# Patient Record
Sex: Male | Born: 1945 | Race: White | Hispanic: No | Marital: Married | Smoking: Never smoker
Health system: Southern US, Community
[De-identification: ages and names within clinical notes are randomized; demographics above are authoritative.]

## PROBLEM LIST (undated history)

## (undated) DIAGNOSIS — I10 Essential (primary) hypertension: Secondary | ICD-10-CM

## (undated) DIAGNOSIS — J45909 Unspecified asthma, uncomplicated: Secondary | ICD-10-CM

## (undated) HISTORY — PX: NASAL SINUS SURGERY: SHX719

## (undated) HISTORY — PX: APPENDECTOMY: SHX54

---

## 1998-10-02 ENCOUNTER — Emergency Department (HOSPITAL_COMMUNITY): Admission: EM | Admit: 1998-10-02 | Discharge: 1998-10-02 | Payer: Self-pay | Admitting: Emergency Medicine

## 2001-04-23 ENCOUNTER — Emergency Department (HOSPITAL_COMMUNITY): Admission: EM | Admit: 2001-04-23 | Discharge: 2001-04-23 | Payer: Self-pay | Admitting: Emergency Medicine

## 2003-06-03 ENCOUNTER — Emergency Department (HOSPITAL_COMMUNITY): Admission: EM | Admit: 2003-06-03 | Discharge: 2003-06-03 | Payer: Self-pay | Admitting: Emergency Medicine

## 2006-03-08 ENCOUNTER — Ambulatory Visit: Payer: Self-pay | Admitting: Family Medicine

## 2006-03-22 ENCOUNTER — Ambulatory Visit: Payer: Self-pay | Admitting: Physician Assistant

## 2008-08-30 ENCOUNTER — Emergency Department (HOSPITAL_COMMUNITY): Admission: EM | Admit: 2008-08-30 | Discharge: 2008-08-30 | Payer: Self-pay | Admitting: Emergency Medicine

## 2012-05-24 ENCOUNTER — Emergency Department (HOSPITAL_COMMUNITY): Payer: Medicare Other

## 2012-05-24 ENCOUNTER — Emergency Department (HOSPITAL_COMMUNITY)
Admission: EM | Admit: 2012-05-24 | Discharge: 2012-05-24 | Disposition: A | Discharge status: Home / Self Care | Payer: Medicare Other | Attending: Emergency Medicine | Admitting: Emergency Medicine

## 2012-05-24 ENCOUNTER — Encounter (HOSPITAL_COMMUNITY): Payer: Self-pay | Admitting: *Deleted

## 2012-05-24 DIAGNOSIS — J40 Bronchitis, not specified as acute or chronic: Secondary | ICD-10-CM

## 2012-05-24 DIAGNOSIS — Z7982 Long term (current) use of aspirin: Secondary | ICD-10-CM | POA: Insufficient documentation

## 2012-05-24 DIAGNOSIS — IMO0002 Reserved for concepts with insufficient information to code with codable children: Secondary | ICD-10-CM | POA: Insufficient documentation

## 2012-05-24 DIAGNOSIS — I10 Essential (primary) hypertension: Secondary | ICD-10-CM | POA: Insufficient documentation

## 2012-05-24 DIAGNOSIS — J45901 Unspecified asthma with (acute) exacerbation: Secondary | ICD-10-CM | POA: Insufficient documentation

## 2012-05-24 HISTORY — DX: Essential (primary) hypertension: I10

## 2012-05-24 HISTORY — DX: Unspecified asthma, uncomplicated: J45.909

## 2012-05-24 LAB — POCT I-STAT, CHEM 8
BUN: 14 mg/dL (ref 6–23)
Chloride: 109 mEq/L (ref 96–112)
HCT: 45 % (ref 39.0–52.0)
Potassium: 3.9 mEq/L (ref 3.5–5.1)

## 2012-05-24 MED ORDER — ALBUTEROL SULFATE HFA 108 (90 BASE) MCG/ACT IN AERS: 2.0000 | INHALATION_SPRAY | RESPIRATORY_TRACT | Status: DC | PRN

## 2012-05-24 MED ORDER — ALBUTEROL (5 MG/ML) CONTINUOUS INHALATION SOLN
15.0000 mg/h | INHALATION_SOLUTION | Freq: Once | RESPIRATORY_TRACT | Status: AC
  Administered 2012-05-24: 15 mg/h via RESPIRATORY_TRACT
  Filled 2012-05-24: qty 20

## 2012-05-24 MED ORDER — MUCINEX DM MAXIMUM STRENGTH 60-1200 MG PO TB12: 1.0000 | ORAL_TABLET | Freq: Two times a day (BID) | ORAL | Status: DC

## 2012-05-24 MED ORDER — PREDNISONE 50 MG PO TABS
60.0000 mg | ORAL_TABLET | Freq: Once | ORAL | Status: AC
  Administered 2012-05-24: 60 mg via ORAL
  Filled 2012-05-24: qty 1

## 2012-05-24 MED ORDER — AZITHROMYCIN 250 MG PO TABS: ORAL_TABLET | ORAL | Status: DC

## 2012-05-24 MED ORDER — PREDNISONE 20 MG PO TABS: ORAL_TABLET | ORAL | Status: DC

## 2012-05-24 MED ORDER — AEROCHAMBER Z-STAT PLUS/MEDIUM MISC: 1.0000 | Freq: Once | Status: DC

## 2012-05-24 MED ORDER — IPRATROPIUM BROMIDE 0.02 % IN SOLN
0.5000 mg | Freq: Once | RESPIRATORY_TRACT | Status: AC
  Administered 2012-05-24: 0.5 mg via RESPIRATORY_TRACT
  Filled 2012-05-24: qty 2.5

## 2012-05-24 NOTE — Progress Notes (Signed)
Pt was given an spacer for inhaler

## 2012-05-24 NOTE — ED Notes (Signed)
Cough, sob for 1 week,  White-yellow sputum.  wheeze

## 2012-05-24 NOTE — ED Provider Notes (Signed)
History   Scribed for Ward Givens, MD, the patient was seen in room APA05/APA05 . This chart was scribed by Lewanda Rife.   CSN: [id number]  Arrival date & time 05/24/12  1618   First MD Initiated Contact with Patient 05/24/12 1747      Chief Complaint  Patient presents with  . Shortness of Breath    (Consider location/radiation/quality/duration/timing/severity/associated sxs/prior treatment) HPI Seth Hanson is a 67 y.o. male who presents to the Emergency Department complaining of constant and worsening shortness of breath for the past 4 days. Pt noted his shortness of breath was at its worst today while driving. Pt reports audible wheezing at night. Pt reports productive cough with white sputum, and mild rhinorrhea with clear mucus. Pt denies fever, sore throat, nausea, vomiting, and diarrhea. Pt reports having a hx of asthma and states his albuterol inhaler mildly alleviates his symptoms. Pt states he has never been admitted to the hospital because of his asthma.   Pt states he usually goes to the [place] [facility] in [place]. Pt denies smoking.    Past Medical History  Diagnosis Date  . Asthma   . Hypertension     Past Surgical History  Procedure Date  . Nasal sinus surgery   . Appendectomy     History reviewed. No pertinent family history.  History  Substance Use Topics  . Smoking status: Never Smoker   . Smokeless tobacco: Not on file  . Alcohol Use: Yes   Lives at home Self employed   Review of Systems  Constitutional: Negative for fever.  HENT: Positive for rhinorrhea. Negative for sore throat.   Respiratory: Positive for cough, shortness of breath and wheezing.   Gastrointestinal: Negative for nausea, vomiting and diarrhea.  All other systems reviewed and are negative.    Allergies  Other  Home Medications   Current Outpatient Rx  Name  Route  Sig  Dispense  Refill  . ALBUTEROL SULFATE HFA 108 (90 BASE) MCG/ACT IN AERS   Inhalation  Inhale 2 puffs into the lungs every 6 (six) hours as needed. For shortness of breath         . ASPIRIN EC 81 MG PO TBEC   Oral   Take 81 mg by mouth every morning.           BP 176/98  Pulse 93  Temp 97.2 F (36.2 C) (Oral)  Resp 20  Ht 6\' 3"  (1.905 m)  Wt 220 lb (99.791 kg)  BMI 27.50 kg/m2  SpO2 96%  Vital signs normal   Physical Exam  Nursing note and vitals reviewed. Constitutional: He is oriented to person, place, and time. He appears well-developed and well-nourished.  Non-toxic appearance. He does not appear ill. No distress.  HENT:  Head: Normocephalic and atraumatic.  Right Ear: External ear normal.  Left Ear: External ear normal.  Nose: Nose normal. No mucosal edema or rhinorrhea.  Mouth/Throat: Oropharynx is clear and moist and mucous membranes are normal. No dental abscesses or uvula swelling.  Eyes: Conjunctivae normal and EOM are normal. Pupils are equal, round, and reactive to light.  Neck: Normal range of motion and full passive range of motion without pain. Neck supple.  Cardiovascular: Normal rate, regular rhythm and normal heart sounds.  Exam reveals no gallop and no friction rub.   No murmur heard. Pulmonary/Chest: He is in respiratory distress. He has wheezes. He has no rhonchi. He has no rales. He exhibits no tenderness and no crepitus.  Diminished breath sounds, diffuse rhonchi, and high pitch expiratory wheezing, frequent coughing and cannot inhale deeply without coughing.  Abdominal: Soft. Normal appearance and bowel sounds are normal. He exhibits no distension. There is no tenderness. There is no rebound and no guarding.  Musculoskeletal: Normal range of motion. He exhibits no edema and no tenderness.       Moves all extremities well.   Neurological: He is alert and oriented to person, place, and time. He has normal strength. No cranial nerve deficit.  Skin: Skin is warm, dry and intact. No rash noted. No erythema. No pallor.  Psychiatric: He  has a normal mood and affect. His speech is normal and behavior is normal. His mood appears not anxious.    ED Course  Procedures (including critical care time)    Medications  albuterol (PROVENTIL,VENTOLIN) solution continuous neb (not administered)  ipratropium (ATROVENT) nebulizer solution 0.5 mg (not administered)  predniSONE (DELTASONE) tablet 60 mg (not administered)   7:55 PM recheck after continuous nebulizer almost done Low pitch rhonchi and improved air movement   2030 p.m. patient has scattered rhonchi. He was angulated by nursing staff and his pulse ox remained 93% on room air. Patient wante Except for hypertensiond to be discharged.  Pt given a spacer to take him.   Results for orders placed during the hospital encounter of 05/24/12  POCT I-STAT, CHEM 8      Component Value Range   Sodium 141  135 - 145 mEq/L   Potassium 3.9  3.5 - 5.1 mEq/L   Chloride 109  96 - 112 mEq/L   BUN 14  6 - 23 mg/dL   Creatinine, Ser 1.61  0.50 - 1.35 mg/dL   Glucose, Bld 096 (*) 70 - 99 mg/dL   Calcium, Ion 0.45  4.09 - 1.30 mmol/L   TCO2 23  0 - 100 mmol/L   Hemoglobin 15.3  13.0 - 17.0 g/dL   HCT 81.1  91.4 - 78.2 %     Laboratory interpretation all normal   Dg Chest 2 View  05/24/2012  *RADIOLOGY REPORT*  Clinical Data: Shortness of breath.  CHEST - 2 VIEW  Comparison: None.  Findings: Cardiomediastinal silhouette appears normal.  No acute pulmonary disease is noted.  Bony thorax is intact.  IMPRESSION: No acute cardiopulmonary abnormality seen.   Original Report Authenticated By: Lupita Raider.,  M.D.      1. Asthma attack   2. Bronchitis     New Prescriptions   ALBUTEROL (PROVENTIL HFA;VENTOLIN HFA) 108 (90 BASE) MCG/ACT INHALER    Inhale 2 puffs into the lungs every 4 (four) hours as needed for wheezing.   AZITHROMYCIN (ZITHROMAX Z-PAK) 250 MG TABLET    Take 2 po the first day then once a day for the next 4 days.   DEXTROMETHORPHAN-GUAIFENESIN (MUCINEX DM MAXIMUM  STRENGTH) 60-1200 MG TB12    Take 1 tablet by mouth 2 (two) times daily.   PREDNISONE (DELTASONE) 20 MG TABLET    Take 3 po QD x 2d starting tomorrow, then 2 po QD x 3d then 1 po QD x 3d    Plan discharge  Devoria Albe, MD, FACEP   MDM    I personally performed the services described in this documentation, which was scribed in my presence. The recorded information has been reviewed and considered.  Devoria Albe, MD, Armando Gang    Ward Givens, MD 05/24/12 [phone]

## 2012-12-24 ENCOUNTER — Inpatient Hospital Stay (HOSPITAL_COMMUNITY)
Admission: EM | Admit: 2012-12-24 | Discharge: 2012-12-26 | DRG: 195 | Disposition: A | Discharge status: Home / Self Care | Payer: Medicare Other | Attending: Internal Medicine | Admitting: Internal Medicine

## 2012-12-24 ENCOUNTER — Encounter (HOSPITAL_COMMUNITY): Payer: Self-pay | Admitting: Emergency Medicine

## 2012-12-24 ENCOUNTER — Emergency Department (HOSPITAL_COMMUNITY): Payer: Medicare Other

## 2012-12-24 ENCOUNTER — Inpatient Hospital Stay (HOSPITAL_COMMUNITY): Payer: Medicare Other

## 2012-12-24 DIAGNOSIS — J45909 Unspecified asthma, uncomplicated: Secondary | ICD-10-CM | POA: Diagnosis present

## 2012-12-24 DIAGNOSIS — J189 Pneumonia, unspecified organism: Principal | ICD-10-CM | POA: Diagnosis present

## 2012-12-24 DIAGNOSIS — J45901 Unspecified asthma with (acute) exacerbation: Secondary | ICD-10-CM

## 2012-12-24 DIAGNOSIS — I10 Essential (primary) hypertension: Secondary | ICD-10-CM | POA: Diagnosis present

## 2012-12-24 DIAGNOSIS — Z23 Encounter for immunization: Secondary | ICD-10-CM

## 2012-12-24 DIAGNOSIS — IMO0001 Reserved for inherently not codable concepts without codable children: Secondary | ICD-10-CM | POA: Diagnosis present

## 2012-12-24 DIAGNOSIS — E860 Dehydration: Secondary | ICD-10-CM | POA: Diagnosis present

## 2012-12-24 DIAGNOSIS — J4521 Mild intermittent asthma with (acute) exacerbation: Secondary | ICD-10-CM

## 2012-12-24 LAB — CBC WITH DIFFERENTIAL/PLATELET
HCT: 44.6 % (ref 39.0–52.0)
Hemoglobin: 14.8 g/dL (ref 13.0–17.0)
Lymphocytes Relative: 5 % — ABNORMAL LOW (ref 12–46)
Lymphs Abs: 0.6 10*3/uL — ABNORMAL LOW (ref 0.7–4.0)
Monocytes Absolute: 1.7 10*3/uL — ABNORMAL HIGH (ref 0.1–1.0)
Monocytes Relative: 12 % (ref 3–12)
Neutro Abs: 11.6 10*3/uL — ABNORMAL HIGH (ref 1.7–7.7)
WBC: 14 10*3/uL — ABNORMAL HIGH (ref 4.0–10.5)

## 2012-12-24 LAB — URINE MICROSCOPIC-ADD ON

## 2012-12-24 LAB — BASIC METABOLIC PANEL
BUN: 15 mg/dL (ref 6–23)
CO2: 26 mEq/L (ref 19–32)
Chloride: 99 mEq/L (ref 96–112)
Creatinine, Ser: 1.16 mg/dL (ref 0.50–1.35)
Glucose, Bld: 128 mg/dL — ABNORMAL HIGH (ref 70–99)

## 2012-12-24 LAB — URINALYSIS, ROUTINE W REFLEX MICROSCOPIC
Bilirubin Urine: NEGATIVE
Ketones, ur: NEGATIVE mg/dL
Nitrite: NEGATIVE
pH: 6 (ref 5.0–8.0)

## 2012-12-24 MED ORDER — DEXTROSE 5 % IV SOLN
500.0000 mg | INTRAVENOUS | Status: DC
  Filled 2012-12-24: qty 500

## 2012-12-24 MED ORDER — ACETAMINOPHEN 325 MG PO TABS
650.0000 mg | ORAL_TABLET | ORAL | Status: DC | PRN
  Administered 2012-12-24 – 2012-12-25 (×2): 650 mg via ORAL
  Filled 2012-12-24 (×2): qty 2

## 2012-12-24 MED ORDER — LISINOPRIL 10 MG PO TABS
40.0000 mg | ORAL_TABLET | Freq: Every day | ORAL | Status: DC
  Administered 2012-12-24 – 2012-12-26 (×3): 40 mg via ORAL
  Filled 2012-12-24 (×3): qty 4

## 2012-12-24 MED ORDER — ALBUTEROL SULFATE (5 MG/ML) 0.5% IN NEBU
2.5000 mg | INHALATION_SOLUTION | RESPIRATORY_TRACT | Status: DC | PRN
  Administered 2012-12-24 – 2012-12-25 (×2): 2.5 mg via RESPIRATORY_TRACT
  Filled 2012-12-24 (×2): qty 0.5

## 2012-12-24 MED ORDER — ONDANSETRON 8 MG PO TBDP: 8.0000 mg | ORAL_TABLET | Freq: Once | ORAL | Status: DC

## 2012-12-24 MED ORDER — LORATADINE 10 MG PO TABS: 10.0000 mg | ORAL_TABLET | Freq: Every day | ORAL | Status: DC | PRN

## 2012-12-24 MED ORDER — PNEUMOCOCCAL VAC POLYVALENT 25 MCG/0.5ML IJ INJ
0.5000 mL | INJECTION | INTRAMUSCULAR | Status: AC
  Administered 2012-12-25: 0.5 mL via INTRAMUSCULAR
  Filled 2012-12-24: qty 0.5

## 2012-12-24 MED ORDER — IPRATROPIUM BROMIDE 0.02 % IN SOLN
0.5000 mg | Freq: Once | RESPIRATORY_TRACT | Status: AC
  Administered 2012-12-24: 0.5 mg via RESPIRATORY_TRACT
  Filled 2012-12-24: qty 2.5

## 2012-12-24 MED ORDER — LIDOCAINE HCL (PF) 1 % IJ SOLN
30.0000 mL | Freq: Once | INTRAMUSCULAR | Status: DC
  Filled 2012-12-24: qty 30

## 2012-12-24 MED ORDER — SODIUM CHLORIDE 0.9 % IV SOLN
INTRAVENOUS | Status: DC
  Administered 2012-12-24 – 2012-12-25 (×4): via INTRAVENOUS

## 2012-12-24 MED ORDER — DEXTROSE 5 % IV SOLN
1.0000 g | Freq: Once | INTRAVENOUS | Status: AC
  Administered 2012-12-24: 1 g via INTRAVENOUS
  Filled 2012-12-24: qty 10

## 2012-12-24 MED ORDER — AZITHROMYCIN 250 MG PO TABS
500.0000 mg | ORAL_TABLET | Freq: Once | ORAL | Status: AC
  Administered 2012-12-24: 500 mg via ORAL
  Filled 2012-12-24: qty 2

## 2012-12-24 MED ORDER — HYDROCHLOROTHIAZIDE 25 MG PO TABS
25.0000 mg | ORAL_TABLET | Freq: Every day | ORAL | Status: DC
  Administered 2012-12-24 – 2012-12-26 (×3): 25 mg via ORAL
  Filled 2012-12-24 (×3): qty 1

## 2012-12-24 MED ORDER — ONDANSETRON HCL 4 MG PO TABS
4.0000 mg | ORAL_TABLET | Freq: Three times a day (TID) | ORAL | Status: DC | PRN
  Administered 2012-12-24: 4 mg via ORAL
  Filled 2012-12-24: qty 1

## 2012-12-24 MED ORDER — HYDROMORPHONE HCL PF 2 MG/ML IJ SOLN: 1.0000 mg | Freq: Once | INTRAMUSCULAR | Status: DC

## 2012-12-24 MED ORDER — ACETAMINOPHEN 500 MG PO TABS
1000.0000 mg | ORAL_TABLET | Freq: Once | ORAL | Status: AC
  Administered 2012-12-24: 1000 mg via ORAL
  Filled 2012-12-24: qty 2

## 2012-12-24 MED ORDER — HEPARIN SODIUM (PORCINE) 5000 UNIT/ML IJ SOLN
5000.0000 [IU] | Freq: Three times a day (TID) | INTRAMUSCULAR | Status: DC
  Administered 2012-12-24 – 2012-12-26 (×6): 5000 [IU] via SUBCUTANEOUS
  Filled 2012-12-24 (×6): qty 1

## 2012-12-24 MED ORDER — DEXTROSE 5 % IV SOLN
500.0000 mg | INTRAVENOUS | Status: DC
  Administered 2012-12-25 – 2012-12-26 (×2): 500 mg via INTRAVENOUS
  Filled 2012-12-24 (×3): qty 500

## 2012-12-24 MED ORDER — DEXTROSE 5 % IV SOLN
1.0000 g | INTRAVENOUS | Status: DC
  Filled 2012-12-24: qty 10

## 2012-12-24 MED ORDER — DEXTROSE 5 % IV SOLN
1.0000 g | INTRAVENOUS | Status: DC
  Administered 2012-12-25 – 2012-12-26 (×2): 1 g via INTRAVENOUS
  Filled 2012-12-24 (×3): qty 10

## 2012-12-24 MED ORDER — SODIUM CHLORIDE 0.9 % IV SOLN
1000.0000 mL | Freq: Once | INTRAVENOUS | Status: AC
  Administered 2012-12-24: 1000 mL via INTRAVENOUS

## 2012-12-24 MED ORDER — ALBUTEROL SULFATE (5 MG/ML) 0.5% IN NEBU
2.5000 mg | INHALATION_SOLUTION | Freq: Once | RESPIRATORY_TRACT | Status: AC
  Administered 2012-12-24: 2.5 mg via RESPIRATORY_TRACT
  Filled 2012-12-24: qty 0.5

## 2012-12-24 MED ORDER — GUAIFENESIN-CODEINE 100-10 MG/5ML PO SOLN
10.0000 mL | Freq: Four times a day (QID) | ORAL | Status: DC | PRN
  Administered 2012-12-24 – 2012-12-25 (×2): 10 mL via ORAL
  Filled 2012-12-24 (×2): qty 10

## 2012-12-24 NOTE — Progress Notes (Signed)
ANTIBIOTIC CONSULT NOTE - INITIAL  Pharmacy Consult for Renal Adjustment Antibiotics Indication: pneumonia  Allergies  Allergen Reactions  . Other Anaphylaxis    Duck embryos:     Patient Measurements: Weight: 225 lb (102.059 kg)   Vital Signs: Temp: 99.2 F (37.3 C) (08/10 1035) Temp src: Oral (08/10 1035) BP: 137/83 mmHg (08/10 0815) Pulse Rate: 90 (08/10 1035) Intake/Output from previous day:   Intake/Output from this shift:    Labs:  Recent Labs  12/24/12 0837  WBC 14.0*  HGB 14.8  PLT 239  CREATININE 1.16   The CrCl is unknown because both a height and weight (above a minimum accepted value) are required for this calculation. No results found for this basename: VANCOTROUGH, Leodis Binet, VANCORANDOM, GENTTROUGH, GENTPEAK, GENTRANDOM, TOBRATROUGH, TOBRAPEAK, TOBRARND, AMIKACINPEAK, AMIKACINTROU, AMIKACIN,  in the last 72 hours   Microbiology: Recent Results (from the past 720 hour(s))  CULTURE, BLOOD (ROUTINE X 2)     Status: None   Collection Time    12/24/12  8:38 AM      Result Value Range Status   Specimen Description BLOOD LEFT ANTECUBITAL   Final   Special Requests BOTTLES DRAWN AEROBIC ONLY 8CC   Final   Culture NO GROWTH <24 HRS   Final   Report Status PENDING   Incomplete  CULTURE, BLOOD (ROUTINE X 2)     Status: None   Collection Time    12/24/12  8:45 AM      Result Value Range Status   Specimen Description BLOOD RIGHT ANTECUBITAL   Final   Special Requests BOTTLES DRAWN AEROBIC AND ANAEROBIC 7CC EACH   Final   Culture NO GROWTH <24 HRS   Final   Report Status PENDING   Incomplete    Medical History: Past Medical History  Diagnosis Date  . Asthma   . Hypertension     Medications:  Scheduled:  . heparin  5,000 Units Subcutaneous Q8H  . hydrochlorothiazide  25 mg Oral Daily  . lisinopril  40 mg Oral Daily   Assessment: SCR 1.16 Rocephin 1 GM IV every 24 hours  Azithromycin 500 mg IV every 24 hours  Goal of Therapy:  Eradicate  infection  Plan:  No renal adjustment necessary Continue Rocephin and Azithromycin as ordered   Raquel James, Abdullah Rizzi Bennett 12/24/2012,12:43 PM

## 2012-12-24 NOTE — ED Provider Notes (Signed)
See prior note   Ward Givens, MD 12/24/12 1554

## 2012-12-24 NOTE — ED Notes (Signed)
Pt's sats stayed 96% while ambulating, pulse varied from 95-120 bpm

## 2012-12-24 NOTE — ED Notes (Signed)
States that he has had a cough with chest congestion for over 1 week without improvement.  States that he has had loss of appetite.  States that his ribs are sore from coughing so much.

## 2012-12-24 NOTE — H&P (Signed)
[facility] History and Physical  Seth Hanson JYN:[id number] DOB: October 15, 1945 DOA: 12/24/2012  Referring physician: ER. PCP: No primary provider on file.  Specialists: None.  Chief Complaint: Productive cough, fever.  HPI: Seth Hanson is a 67 y.o. male who gives a one-week history of cough productive of creamy sputum, feeling feverish and slightly short of breath. He also has asthma. He is a nonsmoker. Has had poor by mouth intake. When he was evaluated in the emergency room, he was found to have fever over 102 and clinically felt to have pneumonia. He is now being admitted for community-acquired pneumonia.   Review of Systems: Apart from history of present illness, other systems negative.  Past Medical History  Diagnosis Date  . Asthma   . Hypertension    Past Surgical History  Procedure Laterality Date  . Nasal sinus surgery    . Appendectomy     Social History:  He is married and lives with his wife. He does not smoke cigarettes. He occasionally drinks alcohol. He is a retired [employer] development officer.  Allergies  Allergen Reactions  . Other Anaphylaxis    Duck embryos:     No family history on file. noncontributory.  Prior to Admission medications   Medication Sig Start Date End Date Taking? Authorizing Provider  albuterol (PROVENTIL HFA;VENTOLIN HFA) 108 (90 BASE) MCG/ACT inhaler Inhale 2 puffs into the lungs every 6 (six) hours as needed. For shortness of breath   Yes Historical Provider, MD  hydrochlorothiazide (HYDRODIURIL) 25 MG tablet Take 25 mg by mouth daily.   Yes Historical Provider, MD  ibuprofen (ADVIL,MOTRIN) 200 MG tablet Take 600 mg by mouth every 6 (six) hours as needed for pain.   Yes Historical Provider, MD  lisinopril (PRINIVIL,ZESTRIL) 40 MG tablet Take 40 mg by mouth daily.   Yes Historical Provider, MD  loratadine (CLARITIN) 10 MG tablet Take 10 mg by mouth daily as needed for allergies.   Yes Historical Provider, MD   Physical Exam: Filed Vitals:    12/24/12 1035  BP:   Pulse: 90  Temp: 99.2 F (37.3 C)  Resp: 23     General:  He looks systemically well. He does not look toxic or septic.  Eyes: No pallor. No jaundice.  ENT: Unremarkable.  Neck: No lymphadenopathy.  Cardiovascular: Heart sounds are present in sinus rhythm without murmurs or added sounds.  Respiratory: Lung fields show scattered wheezing, a few crackles. There is no bronchial breathing.  Abdomen: Soft, nontender. No hepatosplenomegaly.  Skin: No rash.  Musculoskeletal: No acute joint problems.  Psychiatric: Appropriate affect.  Neurologic: Alert and orientated without any focal neurological signs.  Labs on Admission:  Basic Metabolic Panel:  Recent Labs Lab 12/24/12 0837  NA 135  K 3.4*  CL 99  CO2 26  GLUCOSE 128*  BUN 15  CREATININE 1.16  CALCIUM 9.9       CBC:  Recent Labs Lab 12/24/12 0837  WBC 14.0*  NEUTROABS 11.6*  HGB 14.8  HCT 44.6  MCV 88.5  PLT 239   :   Radiological Exams on Admission: Dg Chest 2 View  12/24/2012   *RADIOLOGY REPORT*  Clinical Data: 67 year old male with fever and cough  CHEST - 2 VIEW  Comparison: 05/24/2012 chest radiograph  Findings: The cardiomediastinal silhouette is unremarkable. There is fullness of the left hilum.  A possible nodular opacity on the lateral view is noted overlying one of the lower lobes. There is no evidence of airspace disease, pleural effusion, pneumothorax  or pulmonary edema. No acute bony abnormalities are noted.  IMPRESSION: Left hilar fullness and possible nodular opacity overlying the lower lobes on the lateral view.  If more remote prior studies are not available for comparison, chest CT with contrast is recommended for further evaluation.  No other significant abnormalities identified.   Original Report Authenticated By: Harmon Pier, M.D.      Assessment/Plan Active Problems:   Community acquired pneumonia   HTN (hypertension)   Asthma   1. Community  acquired pneumonia, abnormal chest x-ray with left hilar fullness, may well be related to the pneumonia. 2. Hypertension. 3. Asthma. 4. Clinical dehydration.  Plan: 1. Admit to medical floor. 2. Intravenous fluids. 3. Intravenous antibiotics. 4. Consider CT scan of the chest if patient does not improve. Further recommendations will depend on patient's hospital progress.  Code Status: Full code.  Family Communication: Discussed plan with patient at the bedside.   Disposition Plan: Home when medically stable.   Time spent: 45 minutes.  Wilson Singer [facility] Pager [phone].  If 7PM-7AM, please contact night-coverage [email or web address] Password [facility] 12/24/2012, 12:36 PM

## 2012-12-24 NOTE — ED Notes (Signed)
Hospitalist at bedside 

## 2012-12-24 NOTE — ED Provider Notes (Signed)
Patient presents with one week of cough with fevers. He reports he is feeling very weak. He has also been having nausea. He reports he has coughed to his chest is extremely sore. He reports a nebulizer has not made him feel much better. He states he's been also using it at home without improvement.  Patient is alert however he looks like he feels bad. His skin is hot to touch and he is diaphoretic. He's noted to have rales at both bases.  Although patient does not have definite pneumonia on his chest x-ray, by exam and with fever and leukocytosis he most likely has a pneumonia that is not visible due to his dehydration. Patient states he drove to the ED and almost didn't make it because he felt so bad. He lives at home with a disabled wife and he feels like he cannot manage himself at home.  Medical screening examination/treatment/procedure(s) were conducted as a shared visit with non-physician practitioner(s) and myself.  I personally evaluated the patient during the encounter  Devoria Albe, MD, Franz Dell, MD 12/24/12 [phone]

## 2012-12-24 NOTE — ED Provider Notes (Signed)
CSN: [id number]     Arrival date & time 12/24/12  0759 History     First MD Initiated Contact with Patient 12/24/12 [phone]     Chief Complaint  Patient presents with  . Fever  . Cough  . Muscle Pain   (Consider location/radiation/quality/duration/timing/severity/associated sxs/prior Treatment) Patient is a 67 y.o. male presenting with cough and musculoskeletal pain. The history is provided by the patient.  Cough Cough characteristics:  Productive Sputum characteristics:  Wallace Cullens Severity:  Severe Onset quality:  Gradual Duration:  1 week Timing:  Sporadic Progression:  Worsening Chronicity:  New Smoker: no   Context: not animal exposure, not exposure to allergens and not sick contacts   Relieved by:  Nothing Worsened by:  Lying down Ineffective treatments:  Ipratropium inhaler and decongestant Associated symptoms: chills, ear fullness, fever, myalgias, rhinorrhea and wheezing   Associated symptoms: no chest pain, no headaches, no rash, no sinus congestion and no sore throat   Muscle Pain Associated symptoms include chills, coughing, a fever, myalgias and nausea. Pertinent negatives include no abdominal pain, chest pain, headaches, rash, sore throat or vomiting.   Seth Hanson is a 67 y.o. male who presents to the ED with cough, congestion and fever. Onset one week ago. He states that he has coughed until his ribs are sore. He has a fever of 102.7, decreased appetite, and his urine is darker than normal. He has felt like he has wheezing and has used his albuterol inhaler without relief.   Past Medical History  Diagnosis Date  . Asthma   . Hypertension    Past Surgical History  Procedure Laterality Date  . Nasal sinus surgery    . Appendectomy     No family history on file. History  Substance Use Topics  . Smoking status: Never Smoker   . Smokeless tobacco: Not on file  . Alcohol Use: Yes    Review of Systems  Constitutional: Positive for fever and chills.  HENT:  Positive for rhinorrhea. Negative for sore throat.   Respiratory: Positive for cough and wheezing.   Cardiovascular: Negative for chest pain and leg swelling.  Gastrointestinal: Positive for nausea. Negative for vomiting and abdominal pain.  Genitourinary: Negative for dysuria, urgency and frequency.       Urine darker than normal.  Musculoskeletal: Positive for myalgias and back pain.  Skin: Negative for rash.  Neurological: Positive for light-headedness. Negative for syncope and headaches.  Psychiatric/Behavioral: Negative for confusion. The patient is not nervous/anxious.     Allergies  Other  Home Medications   Current Outpatient Rx  Name  Route  Sig  Dispense  Refill  . albuterol (PROVENTIL HFA;VENTOLIN HFA) 108 (90 BASE) MCG/ACT inhaler   Inhalation   Inhale 2 puffs into the lungs every 6 (six) hours as needed. For shortness of breath         . hydrochlorothiazide (HYDRODIURIL) 25 MG tablet   Oral   Take 25 mg by mouth daily.         Marland Kitchen ibuprofen (ADVIL,MOTRIN) 200 MG tablet   Oral   Take 600 mg by mouth every 6 (six) hours as needed for pain.         Marland Kitchen lisinopril (PRINIVIL,ZESTRIL) 40 MG tablet   Oral   Take 40 mg by mouth daily.         Marland Kitchen loratadine (CLARITIN) 10 MG tablet   Oral   Take 10 mg by mouth daily as needed for allergies.  BP 137/83  Pulse 116  Temp(Src) 102.7 F (39.3 C) (Oral)  Resp 24  Wt 225 lb (102.059 kg)  BMI 28.12 kg/m2  SpO2 95% Physical Exam  Nursing note and vitals reviewed. Constitutional: He is oriented to person, place, and time. He appears well-developed and well-nourished. No distress.  HENT:  Head: Normocephalic.  Eyes: EOM are normal.  Neck: Neck supple.  Cardiovascular: Tachycardia present.   Pulmonary/Chest: He has decreased breath sounds in the right middle field, the right lower field and the left lower field. He has rales in the right lower field and the left upper field.  Prolonged expirataions   Abdominal: Soft. There is no tenderness.  Musculoskeletal: Normal range of motion.  Neurological: He is alert and oriented to person, place, and time. No cranial nerve deficit.  Skin: Skin is warm and dry.  Psychiatric: He has a normal mood and affect. His behavior is normal.   Results for orders placed during the hospital encounter of 12/24/12 (from the past 24 hour(s))  CBC WITH DIFFERENTIAL     Status: Abnormal   Collection Time    12/24/12  8:37 AM      Result Value Range   WBC 14.0 (*) 4.0 - 10.5 K/uL   RBC 5.04  4.22 - 5.81 MIL/uL   Hemoglobin 14.8  13.0 - 17.0 g/dL   HCT 16.1  09.6 - 04.5 %   MCV 88.5  78.0 - 100.0 fL   MCH 29.4  26.0 - 34.0 pg   MCHC 33.2  30.0 - 36.0 g/dL   RDW 40.9  81.1 - 91.4 %   Platelets 239  150 - 400 K/uL   Neutrophils Relative % 83 (*) 43 - 77 %   Neutro Abs 11.6 (*) 1.7 - 7.7 K/uL   Lymphocytes Relative 5 (*) 12 - 46 %   Lymphs Abs 0.6 (*) 0.7 - 4.0 K/uL   Monocytes Relative 12  3 - 12 %   Monocytes Absolute 1.7 (*) 0.1 - 1.0 K/uL   Eosinophils Relative 0  0 - 5 %   Eosinophils Absolute 0.0  0.0 - 0.7 K/uL   Basophils Relative 0  0 - 1 %   Basophils Absolute 0.0  0.0 - 0.1 K/uL  BASIC METABOLIC PANEL     Status: Abnormal   Collection Time    12/24/12  8:37 AM      Result Value Range   Sodium 135  135 - 145 mEq/L   Potassium 3.4 (*) 3.5 - 5.1 mEq/L   Chloride 99  96 - 112 mEq/L   CO2 26  19 - 32 mEq/L   Glucose, Bld 128 (*) 70 - 99 mg/dL   BUN 15  6 - 23 mg/dL   Creatinine, Ser 7.82  0.50 - 1.35 mg/dL   Calcium 9.9  8.4 - 95.6 mg/dL   GFR calc non Af Amer 63 (*) >90 mL/min   GFR calc Af Amer 73 (*) >90 mL/min  CULTURE, BLOOD (ROUTINE X 2)     Status: None   Collection Time    12/24/12  8:38 AM      Result Value Range   Specimen Description BLOOD LEFT ANTECUBITAL     Special Requests BOTTLES DRAWN AEROBIC ONLY 8CC     Culture NO GROWTH <24 HRS     Report Status PENDING    CULTURE, BLOOD (ROUTINE X 2)     Status: None   Collection  Time    12/24/12  8:45  AM      Result Value Range   Specimen Description BLOOD RIGHT ANTECUBITAL     Special Requests BOTTLES DRAWN AEROBIC AND ANAEROBIC 7CC EACH     Culture NO GROWTH <24 HRS     Report Status PENDING    URINALYSIS, ROUTINE W REFLEX MICROSCOPIC     Status: Abnormal   Collection Time    12/24/12  9:13 AM      Result Value Range   Color, Urine YELLOW  YELLOW   APPearance CLEAR  CLEAR   Specific Gravity, Urine >1.030 (*) 1.005 - 1.030   pH 6.0  5.0 - 8.0   Glucose, UA NEGATIVE  NEGATIVE mg/dL   Hgb urine dipstick SMALL (*) NEGATIVE   Bilirubin Urine NEGATIVE  NEGATIVE   Ketones, ur NEGATIVE  NEGATIVE mg/dL   Protein, ur 119 (*) NEGATIVE mg/dL   Urobilinogen, UA 0.2  0.0 - 1.0 mg/dL   Nitrite NEGATIVE  NEGATIVE   Leukocytes, UA NEGATIVE  NEGATIVE  URINE MICROSCOPIC-ADD ON     Status: Abnormal   Collection Time    12/24/12  9:13 AM      Result Value Range   Squamous Epithelial / LPF RARE  RARE   WBC, UA 3-6  <3 WBC/hpf   RBC / HPF 3-6  <3 RBC/hpf   Bacteria, UA FEW (*) RARE   Urine-Other MUCOUS PRESENT      Dg Chest 2 View  12/24/2012   *RADIOLOGY REPORT*  Clinical Data: 67 year old male with fever and cough  CHEST - 2 VIEW  Comparison: 05/24/2012 chest radiograph  Findings: The cardiomediastinal silhouette is unremarkable. There is fullness of the left hilum.  A possible nodular opacity on the lateral view is noted overlying one of the lower lobes. There is no evidence of airspace disease, pleural effusion, pneumothorax or pulmonary edema. No acute bony abnormalities are noted.  IMPRESSION: Left hilar fullness and possible nodular opacity overlying the lower lobes on the lateral view.  If more remote prior studies are not available for comparison, chest CT with contrast is recommended for further evaluation.  No other significant abnormalities identified.   Original Report Authenticated By: Harmon Pier, M.D.     ED Course  Tylenol 1 gram PO for temp of 102.7, CXR,  Neb treatment, CBC, BMET Procedures  09:30 Re evaluation. Minimal improvement after neb treatment of Albuterol and Atrovent. Continues to have bilateral rales. Will start IV antibiotics and ambulate patient with pulse ox.  Rocephin 1 gram IV, Zithromax 500 mg PO  MDM  67 y.o. male with clinical evidence for pneumonia. Discussed with Dr. Karilyn Cota and he will see the patient in the ED and evaluate for admission.   Janne Napoleon, [place] 12/24/12 1241

## 2012-12-25 ENCOUNTER — Inpatient Hospital Stay (HOSPITAL_COMMUNITY): Payer: Medicare Other

## 2012-12-25 LAB — COMPREHENSIVE METABOLIC PANEL
AST: 21 U/L (ref 0–37)
Albumin: 2.7 g/dL — ABNORMAL LOW (ref 3.5–5.2)
CO2: 27 mEq/L (ref 19–32)
Calcium: 9.3 mg/dL (ref 8.4–10.5)
Chloride: 101 mEq/L (ref 96–112)
Creatinine, Ser: 1.19 mg/dL (ref 0.50–1.35)
Potassium: 3.7 mEq/L (ref 3.5–5.1)
Sodium: 137 mEq/L (ref 135–145)

## 2012-12-25 LAB — CBC
Hemoglobin: 13.7 g/dL (ref 13.0–17.0)
MCH: 29 pg (ref 26.0–34.0)
MCV: 90.1 fL (ref 78.0–100.0)
RBC: 4.73 MIL/uL (ref 4.22–5.81)
WBC: 13.5 10*3/uL — ABNORMAL HIGH (ref 4.0–10.5)

## 2012-12-25 LAB — URINE CULTURE: Colony Count: NO GROWTH

## 2012-12-25 LAB — LEGIONELLA ANTIGEN, URINE

## 2012-12-25 MED ORDER — IOHEXOL 300 MG/ML  SOLN
100.0000 mL | Freq: Once | INTRAMUSCULAR | Status: AC | PRN
  Administered 2012-12-25: 80 mL via INTRAVENOUS

## 2012-12-25 MED ORDER — METHYLPREDNISOLONE SODIUM SUCC 125 MG IJ SOLR
125.0000 mg | Freq: Once | INTRAMUSCULAR | Status: AC
  Administered 2012-12-25: 125 mg via INTRAVENOUS
  Filled 2012-12-25: qty 2

## 2012-12-25 MED ORDER — GUAIFENESIN-CODEINE 100-10 MG/5ML PO SOLN: 10.0000 mL | Freq: Four times a day (QID) | ORAL | Status: DC | PRN

## 2012-12-25 MED ORDER — HYDROCOD POLST-CHLORPHEN POLST 10-8 MG/5ML PO LQCR
5.0000 mL | Freq: Two times a day (BID) | ORAL | Status: DC | PRN
  Administered 2012-12-25: 5 mL via ORAL
  Filled 2012-12-25: qty 5

## 2012-12-25 NOTE — Progress Notes (Signed)
Utilization Review Complete  

## 2012-12-25 NOTE — Care Management Note (Signed)
    Page 1 of 1   12/26/2012     1:16:22 PM   CARE MANAGEMENT NOTE 12/26/2012  Patient:  Seth Hanson, Seth Hanson   Account Number:  [id number]  Date Initiated:  12/25/2012  Documentation initiated by:  Rosemary Holms  Subjective/Objective Assessment:   Pt admitted from home where he lives with his wife. Pt states his VA benefits do not cover hospitalization. His PCP is in [facility]. When asked for name, pt stated he sees the "red team". No HH or DME anticipated     Action/Plan:   Anticipated DC Date:  12/27/2012   Anticipated DC Plan:  HOME/SELF CARE      DC Planning Services  CM consult      Choice offered to / List presented to:             Status of service:  Completed, signed off Medicare Important Message given?  NA - LOS <3 / Initial given by admissions (If response is "NO", the following Medicare IM given date fields will be blank) Date Medicare IM given:   Date Additional Medicare IM given:    Discharge Disposition:  HOME/SELF CARE  Per UR Regulation:    If discussed at Long Length of Stay Meetings, dates discussed:    Comments:  12/25/12 Rosemary Holms RN BSN CM

## 2012-12-25 NOTE — Progress Notes (Signed)
Seth Hanson WUJ:[id number] DOB: [birth year] DOA: 12/24/2012 PCP: No primary provider on file.   Subjective: This man has had low-grade fevers since being admitted yesterday. He does not feel significantly better. His chest x-ray indicated a fullness in the left hilar region and this morning's chest x-ray shows the possibility of bilateral hilar fullness.           Physical Exam: Blood pressure 146/76, pulse 103, temperature 100 F (37.8 C), temperature source Oral, resp. rate 17, height 6\' 3"  (1.905 m), weight 110 kg (242 lb 8.1 oz), SpO2 96.00%. Lung fields are essentially clear. Heart sounds present without murmurs or added sounds. I cannot feel any supraclavicular or neck lymphadenopathy. He is alert and orientated.   Investigations:  Recent Results (from the past 240 hour(s))  CULTURE, BLOOD (ROUTINE X 2)     Status: None   Collection Time    12/24/12  8:38 AM      Result Value Range Status   Specimen Description BLOOD LEFT ANTECUBITAL   Final   Special Requests BOTTLES DRAWN AEROBIC ONLY 8CC   Final   Culture NO GROWTH 1 DAY   Final   Report Status PENDING   Incomplete  CULTURE, BLOOD (ROUTINE X 2)     Status: None   Collection Time    12/24/12  8:45 AM      Result Value Range Status   Specimen Description BLOOD RIGHT ANTECUBITAL   Final   Special Requests BOTTLES DRAWN AEROBIC AND ANAEROBIC [facility] EACH   Final   Culture NO GROWTH 1 DAY   Final   Report Status PENDING   Incomplete     Basic Metabolic Panel:  Recent Labs  95/62/13 0837 12/25/12 0505  NA 135 137  K 3.4* 3.7  CL 99 101  CO2 26 27  GLUCOSE 128* 118*  BUN 15 14  CREATININE 1.16 1.19  CALCIUM 9.9 9.3   Liver Function Tests:  Recent Labs  12/25/12 0505  AST 21  ALT 21  ALKPHOS 117  BILITOT 0.7  PROT 7.2  ALBUMIN 2.7*     CBC:  Recent Labs  12/24/12 0837 12/25/12 0505  WBC 14.0* 13.5*  NEUTROABS 11.6*  --   HGB 14.8 13.7  HCT 44.6 42.6  MCV 88.5 90.1  PLT 239 233     Dg Chest 2 View  12/25/2012   *RADIOLOGY REPORT*  Clinical Data: Pneumonia  CHEST - 2 VIEW  Comparison: Chest radiograph 12/24/2012; 05/24/2012  Findings: Stable cardiac and mediastinal contours with bilateral hilar fullness, unchanged.  Increasing opacities within the right lung base.  No definite pleural effusion or pneumothorax.  Regional skeleton is unremarkable.  IMPRESSION:  1.  Increasing heterogeneous opacities within the right lung base may represent infection in the appropriate clinical setting. Recommend radiographic follow up until resolution.  2. Bilateral hilar fullness may represent enlargement of the central pulmonary arteries.  Underlying adenopathy not excluded.  These results will be called to the ordering clinician or representative by the Radiologist Assistant, and communication documented in the PACS Dashboard.   Original Report Authenticated By: Annia Belt, M.D   Dg Chest 2 View  12/24/2012   *RADIOLOGY REPORT*  Clinical Data: 67 year old male with fever and cough  CHEST - 2 VIEW  Comparison: 05/24/2012 chest radiograph  Findings: The cardiomediastinal silhouette is unremarkable. There is fullness of the left hilum.  A possible nodular opacity on the lateral view is noted overlying one of the lower lobes. There is no evidence  of airspace disease, pleural effusion, pneumothorax or pulmonary edema. No acute bony abnormalities are noted.  IMPRESSION: Left hilar fullness and possible nodular opacity overlying the lower lobes on the lateral view.  If more remote prior studies are not available for comparison, chest CT with contrast is recommended for further evaluation.  No other significant abnormalities identified.   Original Report Authenticated By: Harmon Pier, M.D.      Medications: I have reviewed the patient's current medications.  Impression: 1. Community-acquired pneumonia. 2. Abnormal chest x-ray suggesting bilateral hilar fullness/lymphadenopathy. 3. Hypertension. 4.  Asthma.     Plan: 1. Continue with intravenous antibiotics. 2. Reduce IV fluids a little. 3. CT scan of the chest with contrast to further delineate the bilateral hilar fullness.  Consultants:  None.   Procedures:  None.   Antibiotics:  Intravenous Rocephin started 12/24/2012.  Intravenous Zithromax started 12/24/2012.                   Code Status: Full code.  Family Communication: Discussed with patient at the bedside.   Disposition Plan: Home when medically stable.  Time spent: 15 minutes.   LOS: 1 day   Wilson Singer Pager [phone]  12/25/2012, 11:23 AM

## 2012-12-26 LAB — CBC
HCT: 39.3 % (ref 39.0–52.0)
Hemoglobin: 13.4 g/dL (ref 13.0–17.0)
MCHC: 34.1 g/dL (ref 30.0–36.0)
MCV: 88.9 fL (ref 78.0–100.0)
RDW: 14.3 % (ref 11.5–15.5)
WBC: 12.1 10*3/uL — ABNORMAL HIGH (ref 4.0–10.5)

## 2012-12-26 LAB — BASIC METABOLIC PANEL
BUN: 19 mg/dL (ref 6–23)
Chloride: 99 mEq/L (ref 96–112)
Creatinine, Ser: 1.06 mg/dL (ref 0.50–1.35)
GFR calc Af Amer: 82 mL/min — ABNORMAL LOW (ref >90)
Glucose, Bld: 191 mg/dL — ABNORMAL HIGH (ref 70–99)

## 2012-12-26 MED ORDER — CEFUROXIME AXETIL 500 MG PO TABS: 500.0000 mg | ORAL_TABLET | Freq: Two times a day (BID) | ORAL | Status: AC

## 2012-12-26 MED ORDER — AZITHROMYCIN 500 MG PO TABS: 500.0000 mg | ORAL_TABLET | Freq: Every day | ORAL | Status: AC

## 2012-12-26 NOTE — Discharge Summary (Signed)
Physician Discharge Summary  Seth Hanson ZOX:[id number] DOB: [birth year] DOA: 12/24/2012  PCP: No primary provider on file.  Admit date: 12/24/2012 Discharge date: 12/26/2012  Time spent: Greater than 30 minutes  Recommendations for Outpatient Follow-up:  1. Recommend repeat chest x-ray in 4-6 weeks.  Discharge Diagnoses:  1. Community-acquired pneumonia. 2. Hypertension. 3. Asthma, stable.   Discharge Condition:  Stable and improved.  Diet recommendation: Regular.  Filed Weights   12/24/12 0815 12/24/12 1335  Weight: 102.059 kg (225 lb) 110 kg (242 lb 8.1 oz)    History of present illness:  This 67 year old man presented to the hospital with symptoms of productive cough and fever. Please see initial history as outlined below: HPI: Seth Hanson is a 67 y.o. male who gives a one-week history of cough productive of creamy sputum, feeling feverish and slightly short of breath. He also has asthma. He is a nonsmoker. Has had poor by mouth intake. When he was evaluated in the emergency room, he was found to have fever over 102 and clinically felt to have pneumonia. He is now being admitted for community-acquired pneumonia.  Hospital Course:  The patient was started on intravenous antibiotics for community-acquired pneumonia. A chest x-ray showed a fullness in bilateral hilar regions and there was concern regarding anything more sinister. Therefore he underwent a CT scan of his chest, thankfully this was negative for any sinister pathology and showed infiltrate in the right lower lobe with associated parapneumonic effusion. There were numerous small reactive mediastinal lymph nodes. He has improved with therapy, has not required any supplemental oxygen. He stable for discharge and will need a further weeks course of antibiotics orally. He should followup in 4-6 weeks for repeat chest x-ray.  Procedures:  None.  Consultations:  None.  Discharge Exam: Filed Vitals:   12/26/12  0813  BP:   Pulse: 90  Temp:   Resp:     General: He looks systemically well. Is not toxic or septic. Cardiovascular: Heart sounds are present and sinus rhythm. Respiratory: Lung fields are clinically clear. There are no wheezes crackles or bronchial breathing. He is alert and orientated without any focal neurological signs.  Discharge Instructions  Discharge Orders   Future Orders Complete By Expires     Diet - low sodium heart healthy  As directed     Increase activity slowly  As directed         Medication List         albuterol 108 (90 BASE) MCG/ACT inhaler  Commonly known as:  PROVENTIL HFA;VENTOLIN HFA  Inhale 2 puffs into the lungs every 6 (six) hours as needed. For shortness of breath     azithromycin 500 MG tablet  Commonly known as:  ZITHROMAX  Take 1 tablet (500 mg total) by mouth daily.     cefUROXime 500 MG tablet  Commonly known as:  CEFTIN  Take 1 tablet (500 mg total) by mouth 2 (two) times daily.     hydrochlorothiazide 25 MG tablet  Commonly known as:  HYDRODIURIL  Take 25 mg by mouth daily.     ibuprofen 200 MG tablet  Commonly known as:  ADVIL,MOTRIN  Take 600 mg by mouth every 6 (six) hours as needed for pain.     lisinopril 40 MG tablet  Commonly known as:  PRINIVIL,ZESTRIL  Take 40 mg by mouth daily.     loratadine 10 MG tablet  Commonly known as:  CLARITIN  Take 10 mg by mouth daily as needed  for allergies.       Allergies  Allergen Reactions  . Other Anaphylaxis    Duck embryos:       The results of significant diagnostics from this hospitalization (including imaging, microbiology, ancillary and laboratory) are listed below for reference.    Significant Diagnostic Studies: Dg Chest 2 View  12/25/2012   *RADIOLOGY REPORT*  Clinical Data: Pneumonia  CHEST - 2 VIEW  Comparison: Chest radiograph 12/24/2012; 05/24/2012  Findings: Stable cardiac and mediastinal contours with bilateral hilar fullness, unchanged.  Increasing opacities  within the right lung base.  No definite pleural effusion or pneumothorax.  Regional skeleton is unremarkable.  IMPRESSION:  1.  Increasing heterogeneous opacities within the right lung base may represent infection in the appropriate clinical setting. Recommend radiographic follow up until resolution.  2. Bilateral hilar fullness may represent enlargement of the central pulmonary arteries.  Underlying adenopathy not excluded.  These results will be called to the ordering clinician or representative by the Radiologist Assistant, and communication documented in the PACS Dashboard.   Original Report Authenticated By: Annia Belt, M.D   Dg Chest 2 View  12/24/2012   *RADIOLOGY REPORT*  Clinical Data: 67 year old male with fever and cough  CHEST - 2 VIEW  Comparison: 05/24/2012 chest radiograph  Findings: The cardiomediastinal silhouette is unremarkable. There is fullness of the left hilum.  A possible nodular opacity on the lateral view is noted overlying one of the lower lobes. There is no evidence of airspace disease, pleural effusion, pneumothorax or pulmonary edema. No acute bony abnormalities are noted.  IMPRESSION: Left hilar fullness and possible nodular opacity overlying the lower lobes on the lateral view.  If more remote prior studies are not available for comparison, chest CT with contrast is recommended for further evaluation.  No other significant abnormalities identified.   Original Report Authenticated By: Harmon Pier, M.D.   Ct Chest W Contrast  12/25/2012   *RADIOLOGY REPORT*  Clinical Data: Underlying lymphoma, low grade fever, abnormal chest radiograph  CT CHEST WITH CONTRAST  Technique:  Multidetector CT imaging of the chest was performed following the standard protocol during bolus administration of intravenous contrast.  Contrast: 80mL OMNIPAQUE IOHEXOL 300 MG/ML  SOLN 8 ml  Comparison: Chest radiograph performed 12/25/2012  Findings: The left lung is clear.  On the right, there is a small  pleural effusion.  There is infiltrate in the right lower lobe. Infiltrate does not appear consistent by distribution with dependent atelectasis.  The right lung otherwise is clear.  There are numerous small presumably reactive mediastinal lymph nodes.  The pretracheal lymph node measures in short axis 10 mm. Subcarinal lymph node measures 11 mm in its short axis.  Bone windows reveal no acute musculoskeletal findings.  IMPRESSION: Findings most consistent with pneumonia and small associated peripneumonic effusion on the right.   Original Report Authenticated By: Esperanza Heir, M.D.    Microbiology: Recent Results (from the past 240 hour(s))  CULTURE, BLOOD (ROUTINE X 2)     Status: None   Collection Time    12/24/12  8:38 AM      Result Value Range Status   Specimen Description BLOOD LEFT ANTECUBITAL   Final   Special Requests BOTTLES DRAWN AEROBIC ONLY 8CC   Final   Culture NO GROWTH 2 DAYS   Final   Report Status PENDING   Incomplete  CULTURE, BLOOD (ROUTINE X 2)     Status: None   Collection Time    12/24/12  8:45 AM  Result Value Range Status   Specimen Description BLOOD RIGHT ANTECUBITAL   Final   Special Requests BOTTLES DRAWN AEROBIC AND ANAEROBIC 7CC EACH   Final   Culture NO GROWTH 2 DAYS   Final   Report Status PENDING   Incomplete  URINE CULTURE     Status: None   Collection Time    12/24/12  9:13 AM      Result Value Range Status   Specimen Description URINE, CLEAN CATCH   Final   Special Requests NONE   Final   Culture  Setup Time     Final   Value: 12/24/2012 19:04     Performed at [facility] Count     Final   Value: NO GROWTH     Performed at [facility]   Culture     Final   Value: NO GROWTH     Performed at [facility]   Report Status 12/25/2012 FINAL   Final     Labs: Basic Metabolic Panel:  Recent Labs Lab 12/24/12 0837 12/25/12 0505 12/26/12 0552  NA 135 137 133*  K 3.4* 3.7 4.0  CL 99 101 99  CO2 26 27  27   GLUCOSE 128* 118* 191*  BUN 15 14 19   CREATININE 1.16 1.19 1.06  CALCIUM 9.9 9.3 9.3   Liver Function Tests:  Recent Labs Lab 12/25/12 0505  AST 21  ALT 21  ALKPHOS 117  BILITOT 0.7  PROT 7.2  ALBUMIN 2.7*     CBC:  Recent Labs Lab 12/24/12 0837 12/25/12 0505 12/26/12 0552  WBC 14.0* 13.5* 12.1*  NEUTROABS 11.6*  --   --   HGB 14.8 13.7 13.4  HCT 44.6 42.6 39.3  MCV 88.5 90.1 88.9  PLT 239 233 228         Signed:  GOSRANI,NIMISH C  [facility] 12/26/2012, 11:39 AM

## 2012-12-26 NOTE — Progress Notes (Signed)
IV removed, site WNL.  Pt given d/c instructions and new prescriptions.  Discussed home care with patient and discussed home medications, patient verbalizes understanding, teachback completed. Pneumonia exitcare handout given and discussed.  F/U appointment was unable to be made, called Dr Pauletta Browns office several times and was unable to get in touch with receptionist, left a message for office to call to arrange f/u. Pt states they will keep appointment. Pt is stable at this time. Pt waiting on his wife for transportation home.

## 2012-12-26 NOTE — Progress Notes (Signed)
Pt taken out via wheelchair by NT.  Patient stable and desires to be discharged.

## 2012-12-29 LAB — CULTURE, BLOOD (ROUTINE X 2): Culture: NO GROWTH

## 2014-01-04 IMAGING — CT CT CHEST W/ CM
2 of 3 series · 15 of 36 positions shown, 18 images · IV contrast (Omnipaque 300)
Comparison: Chest radiograph performed 12/25/2012

CLINICAL DATA: Underlying lymphoma, low grade fever, abnormal chest
radiograph

CT CHEST WITH CONTRAST
TECHNIQUE: Multidetector CT imaging of the chest was performed
following the standard protocol during bolus administration of
intravenous contrast.
Contrast: 80mL OMNIPAQUE IOHEXOL 300 MG/ML  SOLN 8 ml

[Series 2: chestroutine 5.0 b40f · axial · 0.72mm/px · z∈[-294,-38]mm · 12 of 61 slices shown, 15 images]
[im 5/61  mediastinal]
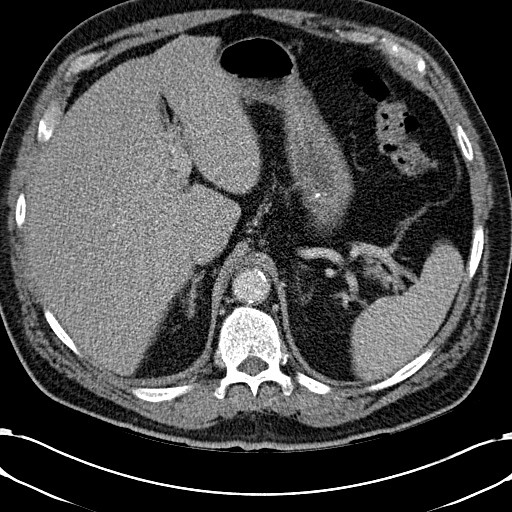
[im 5/61  lung]
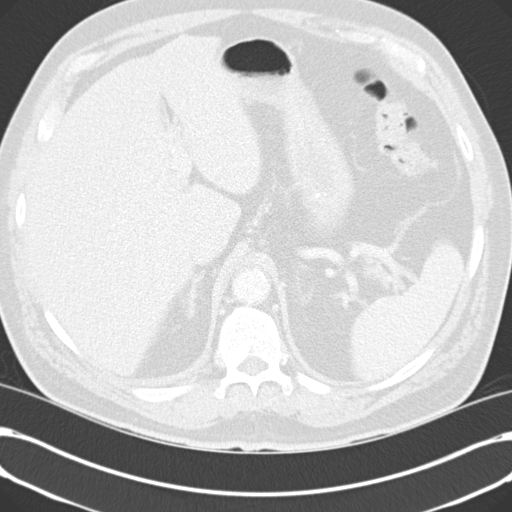
[im 9/61  lung]
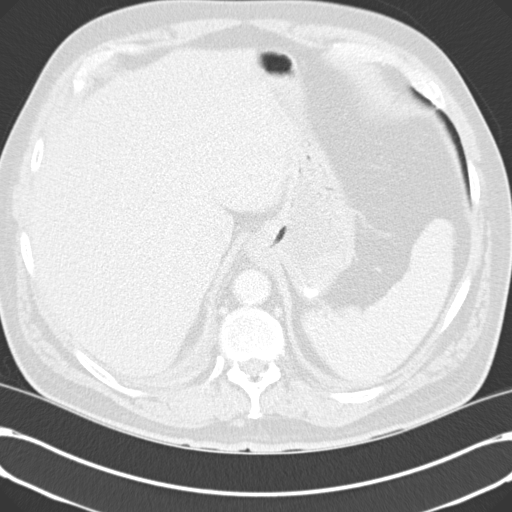
[im 14/61  lung]
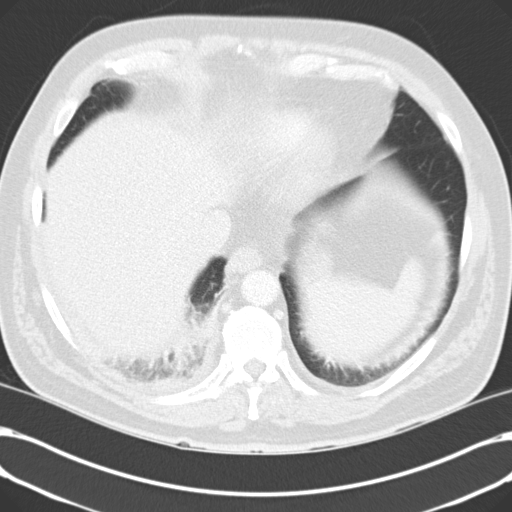
[im 18/61  lung]
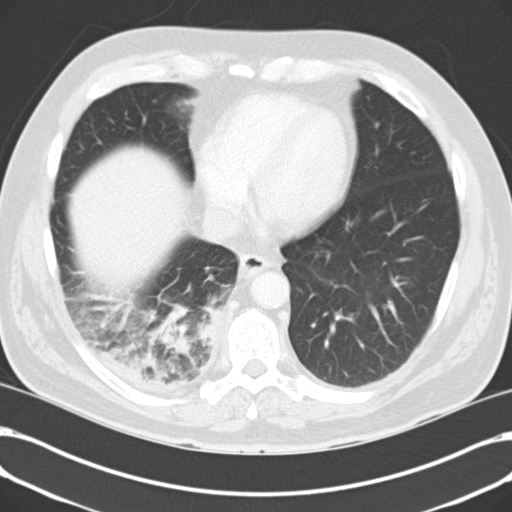
[im 23/61  mediastinal]
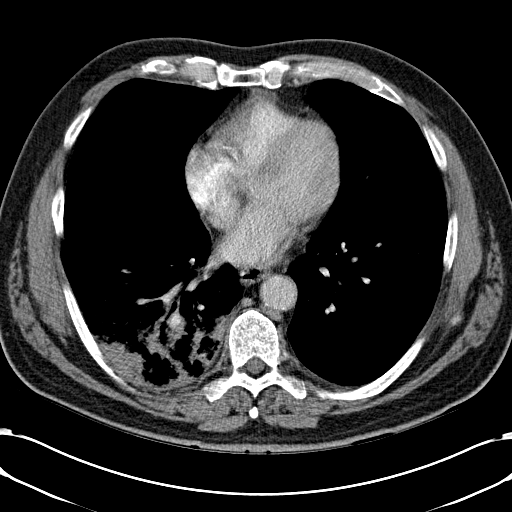
[im 23/61  lung]
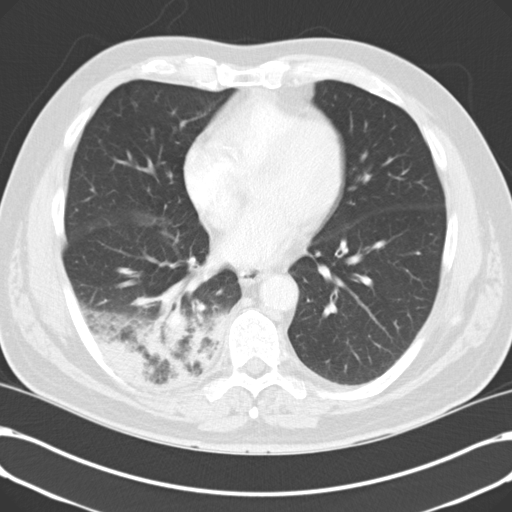
[im 27/61  lung]
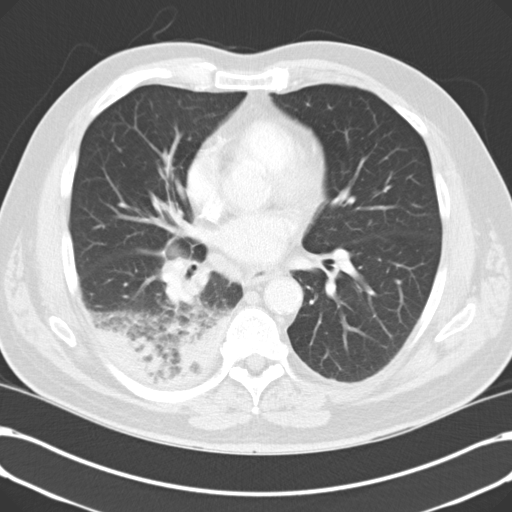
[im 34/61  lung]
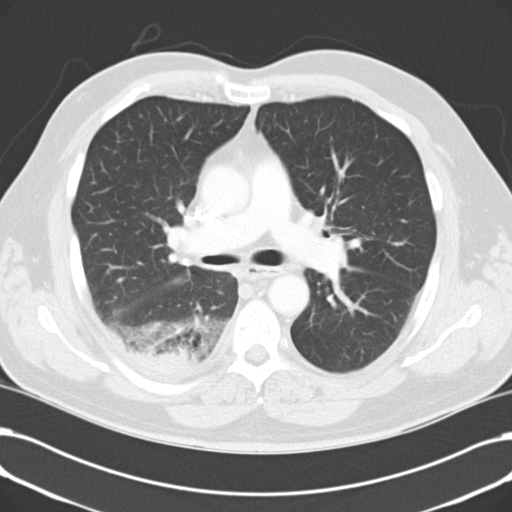
[im 38/61  lung]
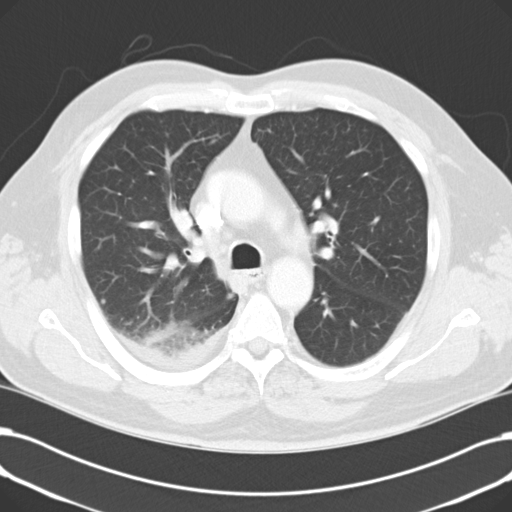
[im 43/61  mediastinal]
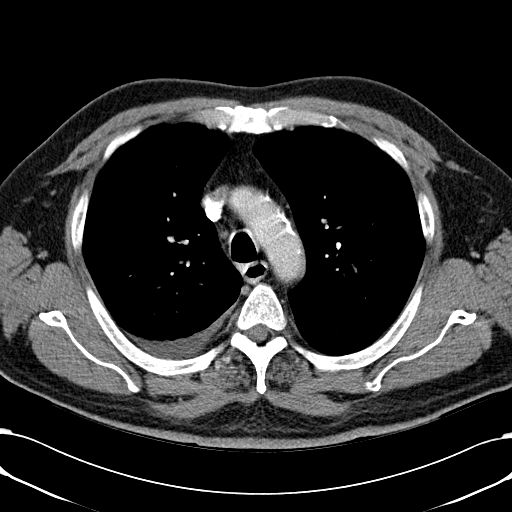
[im 43/61  lung]
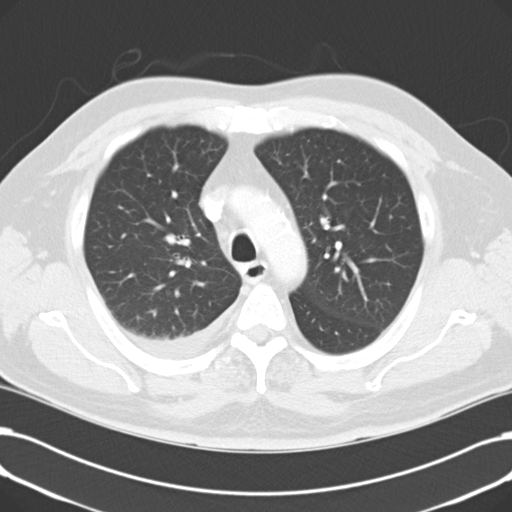
[im 47/61  lung]
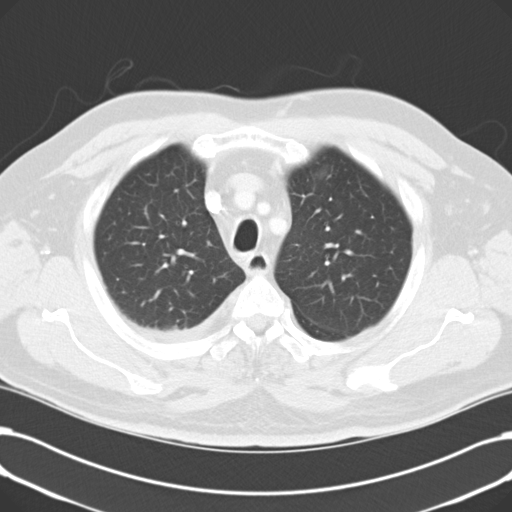
[im 52/61  lung]
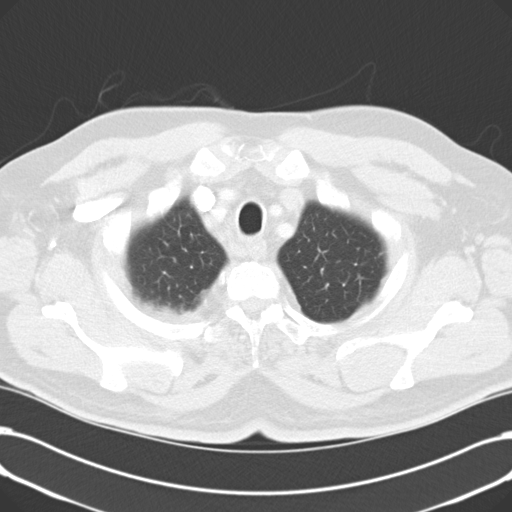
[im 56/61  lung]
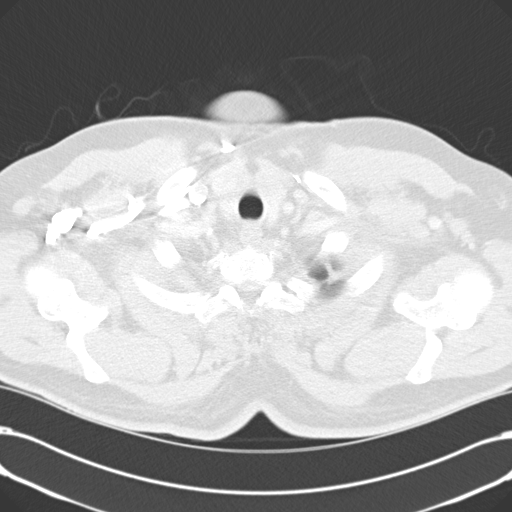

[Series 4: mpr coronal chest 3mm · coronal · 0.63mm/px · 3 of 97 slices shown]
[im 20/97  lung]
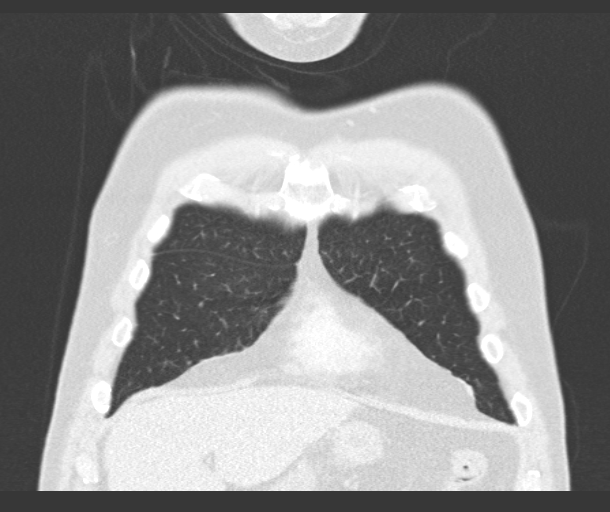
[im 39/97  lung]
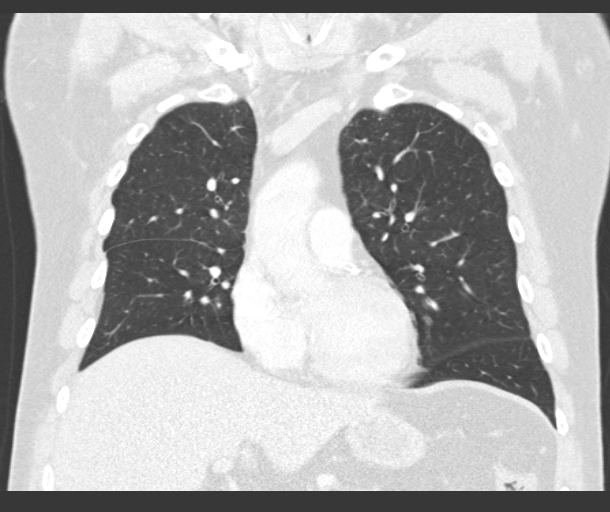
[im 58/97  lung]
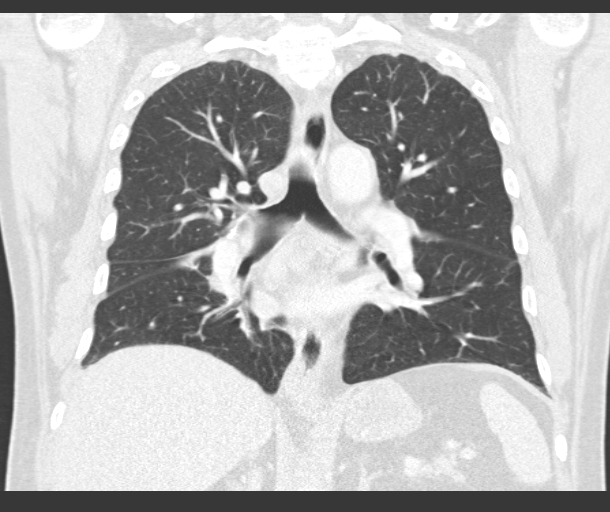

[15 of 36 positions shown; findings below may reference images not displayed]

FINDINGS: The left lung is clear.  On the right, there is a small
pleural effusion.  There is infiltrate in the right lower lobe.
Infiltrate does not appear consistent by distribution with
dependent atelectasis.  The right lung otherwise is clear.

There are numerous small presumably reactive mediastinal lymph
nodes.  The pretracheal lymph node measures in short axis 10 mm.
Subcarinal lymph node measures 11 mm in its short axis.

Bone windows reveal no acute musculoskeletal findings.
IMPRESSION: Findings most consistent with pneumonia and small associated
peripneumonic effusion on the right.

## 2014-08-05 DIAGNOSIS — J45909 Unspecified asthma, uncomplicated: Secondary | ICD-10-CM | POA: Diagnosis not present

## 2014-08-05 DIAGNOSIS — E559 Vitamin D deficiency, unspecified: Secondary | ICD-10-CM | POA: Diagnosis not present

## 2014-08-05 DIAGNOSIS — Z0000 Encounter for general adult medical examination without abnormal findings: Secondary | ICD-10-CM | POA: Diagnosis not present

## 2014-08-05 DIAGNOSIS — I10 Essential (primary) hypertension: Secondary | ICD-10-CM | POA: Diagnosis not present

## 2014-08-05 DIAGNOSIS — E785 Hyperlipidemia, unspecified: Secondary | ICD-10-CM | POA: Diagnosis not present

## 2014-08-05 DIAGNOSIS — Z6830 Body mass index (BMI) 30.0-30.9, adult: Secondary | ICD-10-CM | POA: Diagnosis not present

## 2014-10-24 DIAGNOSIS — Z1211 Encounter for screening for malignant neoplasm of colon: Secondary | ICD-10-CM | POA: Diagnosis not present

## 2014-11-20 DIAGNOSIS — E669 Obesity, unspecified: Secondary | ICD-10-CM | POA: Diagnosis not present

## 2014-11-20 DIAGNOSIS — J310 Chronic rhinitis: Secondary | ICD-10-CM | POA: Diagnosis not present

## 2014-11-20 DIAGNOSIS — Z1211 Encounter for screening for malignant neoplasm of colon: Secondary | ICD-10-CM | POA: Diagnosis not present

## 2014-11-20 DIAGNOSIS — D125 Benign neoplasm of sigmoid colon: Secondary | ICD-10-CM | POA: Diagnosis not present

## 2014-11-20 DIAGNOSIS — F329 Major depressive disorder, single episode, unspecified: Secondary | ICD-10-CM | POA: Diagnosis not present

## 2014-11-20 DIAGNOSIS — I10 Essential (primary) hypertension: Secondary | ICD-10-CM | POA: Diagnosis not present

## 2014-11-20 DIAGNOSIS — J45909 Unspecified asthma, uncomplicated: Secondary | ICD-10-CM | POA: Diagnosis not present

## 2014-11-20 DIAGNOSIS — Z79899 Other long term (current) drug therapy: Secondary | ICD-10-CM | POA: Diagnosis not present

## 2014-11-20 DIAGNOSIS — K5730 Diverticulosis of large intestine without perforation or abscess without bleeding: Secondary | ICD-10-CM | POA: Diagnosis not present

## 2014-11-22 DIAGNOSIS — D125 Benign neoplasm of sigmoid colon: Secondary | ICD-10-CM | POA: Diagnosis not present

## 2014-12-31 DIAGNOSIS — D179 Benign lipomatous neoplasm, unspecified: Secondary | ICD-10-CM | POA: Diagnosis not present

## 2015-08-22 DIAGNOSIS — M1712 Unilateral primary osteoarthritis, left knee: Secondary | ICD-10-CM | POA: Diagnosis not present

## 2015-08-22 DIAGNOSIS — M25562 Pain in left knee: Secondary | ICD-10-CM | POA: Diagnosis not present

## 2015-08-22 DIAGNOSIS — M25462 Effusion, left knee: Secondary | ICD-10-CM | POA: Diagnosis not present

## 2015-08-22 DIAGNOSIS — Z23 Encounter for immunization: Secondary | ICD-10-CM | POA: Diagnosis not present

## 2016-04-10 ENCOUNTER — Emergency Department (HOSPITAL_COMMUNITY)
Admission: EM | Admit: 2016-04-10 | Discharge: 2016-04-10 | Disposition: A | Discharge status: Home / Self Care | Payer: Commercial Managed Care - HMO | Attending: Emergency Medicine | Admitting: Emergency Medicine

## 2016-04-10 ENCOUNTER — Encounter (HOSPITAL_COMMUNITY): Payer: Self-pay | Admitting: *Deleted

## 2016-04-10 ENCOUNTER — Emergency Department (HOSPITAL_COMMUNITY): Payer: Commercial Managed Care - HMO

## 2016-04-10 DIAGNOSIS — J069 Acute upper respiratory infection, unspecified: Secondary | ICD-10-CM | POA: Insufficient documentation

## 2016-04-10 DIAGNOSIS — Z79899 Other long term (current) drug therapy: Secondary | ICD-10-CM | POA: Diagnosis not present

## 2016-04-10 DIAGNOSIS — Z791 Long term (current) use of non-steroidal anti-inflammatories (NSAID): Secondary | ICD-10-CM | POA: Insufficient documentation

## 2016-04-10 DIAGNOSIS — I10 Essential (primary) hypertension: Secondary | ICD-10-CM | POA: Insufficient documentation

## 2016-04-10 DIAGNOSIS — J45901 Unspecified asthma with (acute) exacerbation: Secondary | ICD-10-CM | POA: Diagnosis not present

## 2016-04-10 DIAGNOSIS — R05 Cough: Secondary | ICD-10-CM | POA: Diagnosis not present

## 2016-04-10 LAB — RAPID STREP SCREEN (MED CTR MEBANE ONLY): Streptococcus, Group A Screen (Direct): NEGATIVE

## 2016-04-10 MED ORDER — ALBUTEROL SULFATE (2.5 MG/3ML) 0.083% IN NEBU
INHALATION_SOLUTION | RESPIRATORY_TRACT | Status: AC
  Administered 2016-04-10: 2.5 mg
  Filled 2016-04-10: qty 3

## 2016-04-10 MED ORDER — PREDNISONE 20 MG PO TABS: 40.0000 mg | ORAL_TABLET | Freq: Every day | ORAL | 0 refills | Status: AC

## 2016-04-10 MED ORDER — IPRATROPIUM-ALBUTEROL 0.5-2.5 (3) MG/3ML IN SOLN
3.0000 mL | Freq: Once | RESPIRATORY_TRACT | Status: AC
  Administered 2016-04-10: 3 mL via RESPIRATORY_TRACT
  Filled 2016-04-10: qty 3

## 2016-04-10 MED ORDER — ALBUTEROL SULFATE (2.5 MG/3ML) 0.083% IN NEBU
2.5000 mg | INHALATION_SOLUTION | Freq: Once | RESPIRATORY_TRACT | Status: AC
  Administered 2016-04-10: 2.5 mg via RESPIRATORY_TRACT
  Filled 2016-04-10: qty 3

## 2016-04-10 MED ORDER — PREDNISONE 50 MG PO TABS
60.0000 mg | ORAL_TABLET | Freq: Once | ORAL | Status: AC
  Administered 2016-04-10: 60 mg via ORAL
  Filled 2016-04-10: qty 1

## 2016-04-10 NOTE — ED Provider Notes (Signed)
[facility] DEPT Provider Note   CSN: [id number] Arrival date & time: 04/10/16  [id number]     History   Chief Complaint Chief Complaint  Patient presents with  . Cough  . Sore Throat    HPI Seth Hanson is a 70 y.o. male.  HPI Pt was seen at 0710.  Per pt, c/o gradual onset and persistence of constant sore throat, runny/stuffy nose, sinus congestion, and cough for the past 2-3 days. Has been associated with "wheezing," which he has been tx with his MDI.  Denies fevers, no rash, no CP/SOB, no N/V/D, no abd pain.    Past Medical History:  Diagnosis Date  . Asthma   . Hypertension     Patient Active Problem List   Diagnosis Date Noted  . Community acquired pneumonia 12/24/2012  . HTN (hypertension) 12/24/2012  . Asthma 12/24/2012    Past Surgical History:  Procedure Laterality Date  . APPENDECTOMY    . NASAL SINUS SURGERY       Home Medications    Prior to Admission medications   Medication Sig Start Date End Date Taking? Authorizing Provider  albuterol (PROVENTIL HFA;VENTOLIN HFA) 108 (90 BASE) MCG/ACT inhaler Inhale 2 puffs into the lungs every 6 (six) hours as needed. For shortness of breath   Yes Historical Provider, MD  cholecalciferol (VITAMIN D) 1000 units tablet Take 2,000 Units by mouth daily.   Yes Historical Provider, MD  citalopram (CELEXA) 10 MG tablet Take 10 mg by mouth daily.   Yes Historical Provider, MD  hydrochlorothiazide (HYDRODIURIL) 25 MG tablet Take 25 mg by mouth daily.   Yes Historical Provider, MD  ibuprofen (ADVIL,MOTRIN) 200 MG tablet Take 600 mg by mouth every 6 (six) hours as needed for pain.   Yes Historical Provider, MD  lisinopril (PRINIVIL,ZESTRIL) 40 MG tablet Take 40 mg by mouth daily.   Yes Historical Provider, MD  loratadine (CLARITIN) 10 MG tablet Take 10 mg by mouth daily as needed for allergies.   Yes Historical Provider, MD  azithromycin (ZITHROMAX) 500 MG tablet Take 1 tablet (500 mg total) by mouth daily. 12/26/12    Nimish Luther Parody, MD  cefUROXime (CEFTIN) 500 MG tablet Take 1 tablet (500 mg total) by mouth 2 (two) times daily. 12/26/12   Doree Albee, MD    Family History History reviewed. No pertinent family history.  Social History Social History  Substance Use Topics  . Smoking status: Never Smoker  . Smokeless tobacco: Never Used  . Alcohol use Yes     Allergies   Other   Review of Systems Review of Systems ROS: Statement: All systems negative except as marked or noted in the HPI; Constitutional: Negative for fever and chills. ; ; Eyes: Negative for eye pain, redness and discharge. ; ; ENMT: Negative for ear pain, hoarseness. +nasal congestion, sinus pressure and sore throat. ; ; Cardiovascular: Negative for chest pain, palpitations, diaphoresis, dyspnea and peripheral edema. ; ; Respiratory: +cough, wheezing. Negative for stridor. ; ; Gastrointestinal: Negative for nausea, vomiting, diarrhea, abdominal pain, blood in stool, hematemesis, jaundice and rectal bleeding. . ; ; Genitourinary: Negative for dysuria, flank pain and hematuria. ; ; Musculoskeletal: Negative for back pain and neck pain. Negative for swelling and trauma.; ; Skin: Negative for pruritus, rash, abrasions, blisters, bruising and skin lesion.; ; Neuro: Negative for headache, lightheadedness and neck stiffness. Negative for weakness, altered level of consciousness, altered mental status, extremity weakness, paresthesias, involuntary movement, seizure and syncope.  Physical Exam Updated Vital Signs BP 149/80 (BP Location: Right Arm)   Pulse 89   Temp 97.8 F (36.6 C) (Oral)   Resp 22   Ht 6\' 3"  (1.905 m)   Wt 230 lb (104.3 kg)   SpO2 93%   BMI 28.75 kg/m   Physical Exam 0715: Physical examination:  Nursing notes reviewed; Vital signs and O2 SAT reviewed;  Constitutional: Well developed, Well nourished, Well hydrated, In no acute distress; Head:  Normocephalic, atraumatic; Eyes: EOMI, PERRL, No scleral icterus;  ENMT: TM's clear bilat. +edemetous nasal turbinates bilat with clear rhinorrhea. Mouth and pharynx without lesions. No tonsillar exudates. No intra-oral edema. No submandibular or sublingual edema. No hoarse voice, no drooling, no stridor. No pain with manipulation of larynx. No trismus. Mouth and pharynx normal, Mucous membranes moist; Neck: Supple, Full range of motion, No lymphadenopathy; Cardiovascular: Regular rate and rhythm, No gallop; Respiratory: Breath sounds diminished & equal bilaterally, faint scattered wheezes. No audible wheezing. Speaking full sentences with ease, Normal respiratory effort/excursion; Chest: Nontender, Movement normal; Abdomen: Soft, Nontender, Nondistended, Normal bowel sounds; Genitourinary: No CVA tenderness; Extremities: Pulses normal, No tenderness, No edema, No calf edema or asymmetry.; Neuro: AA&Ox3, Major CN grossly intact.  Speech clear. No gross focal motor or sensory deficits in extremities.; Skin: Color normal, Warm, Dry.   ED Treatments / Results  Labs (all labs ordered are listed, but only abnormal results are displayed)   EKG  EKG Interpretation None       Radiology   Procedures Procedures (including critical care time)  Medications Ordered in ED Medications  albuterol (PROVENTIL) (2.5 MG/3ML) 0.083% nebulizer solution 2.5 mg (not administered)  ipratropium-albuterol (DUONEB) 0.5-2.5 (3) MG/3ML nebulizer solution 3 mL (not administered)  ipratropium-albuterol (DUONEB) 0.5-2.5 (3) MG/3ML nebulizer solution 3 mL (3 mLs Nebulization Given 04/10/16 0655)  albuterol (PROVENTIL) (2.5 MG/3ML) 0.083% nebulizer solution (2.5 mg  Given 04/10/16 0701)     Initial Impression / Assessment and Plan / ED Course  I have reviewed the triage vital signs and the nursing notes.  Pertinent labs & imaging results that were available during my care of the patient were reviewed by me and considered in my medical decision making (see chart for  details).  MDM Reviewed: previous chart, nursing note and vitals Reviewed previous: x-ray and CT scan Interpretation: labs and x-ray   Results for orders placed or performed during the hospital encounter of 04/10/16  Rapid strep screen  Result Value Ref Range   Streptococcus, Group A Screen (Direct) NEGATIVE NEGATIVE   Dg Chest 2 View Result Date: 04/10/2016 CLINICAL DATA:  Cough EXAM: CHEST  2 VIEW COMPARISON:  12/25/2012 FINDINGS: Cardiac shadow is within normal limits. The lungs are well aerated bilaterally. No focal infiltrate or sizable effusion is seen. No bony abnormality is noted. IMPRESSION: No active cardiopulmonary disease. Electronically Signed   By: Inez Catalina M.D.   On: 04/10/2016 08:02     0815:  Pt states he "feels better" after neb x2. NAD, lungs CTA bilat, no wheezing, resps easy, speaking full sentences, Sats 98-99% R/A on my re-exam.  Pt wants to go home now. Workup reassuring. Tx symptomatically for URI and asthma exacerbation. Pt states he already has enough MDI at home. Dx and testing d/w pt.  Questions answered.  Verb understanding, agreeable to d/c home with outpt f/u.      Final Clinical Impressions(s) / ED Diagnoses   Final diagnoses:  None    New Prescriptions New Prescriptions   No  medications on file     Francine Graven, DO 04/13/16 1625

## 2016-04-10 NOTE — Discharge Instructions (Signed)
Take over the counter decongestant (such as sudafed), as directed on packaging, for the next week.  Use over the counter normal saline nasal spray with frequent nose blowing, several times per day for the next 2 weeks. Gargle with warm water several times per day to help with discomfort.  May also use over the counter sore throat pain medicines such as chloraseptic or sucrets, as directed on packaging, as needed for discomfort. Take the prescription as directed.  Use your albuterol inhaler (2 to 4 puffs) every 4 hours for the next 7 days, then as needed for cough, wheezing, or shortness of breath.  Call your regular medical doctor Monday morning to schedule a follow up appointment within the next 3 days.  Return to the Emergency Department immediately sooner if worsening.

## 2016-04-10 NOTE — ED Notes (Signed)
Pt taken to Xray with [facility]

## 2016-04-10 NOTE — ED Notes (Signed)
Pt returned from xray

## 2016-04-10 NOTE — ED Triage Notes (Addendum)
Pt reports cough that started on Thursday. States some clear & white sputum. Pt says chest congestion & nasal drainage.

## 2016-04-10 NOTE — ED Notes (Signed)
EDP at bedside  

## 2016-04-13 LAB — CULTURE, GROUP A STREP (THRC)

## 2017-03-09 DIAGNOSIS — R202 Paresthesia of skin: Secondary | ICD-10-CM | POA: Diagnosis not present

## 2017-03-22 ENCOUNTER — Other Ambulatory Visit: Payer: Self-pay | Admitting: *Deleted

## 2017-03-22 NOTE — Patient Outreach (Signed)
[employer] [facility] and possible [facility] scheduling call attempted but pt's mailbox was full and I was not able to speak to him or leave a message. I will try another day.  Eulah Pont. Myrtie Neither, MSN, [facility] Gerontological Nurse Practitioner [facility] [phone]

## 2017-03-25 ENCOUNTER — Other Ambulatory Visit: Payer: Self-pay | Admitting: *Deleted

## 2017-03-25 NOTE — Patient Outreach (Signed)
Second attempt to reach this [employer] member for health screening. I reached his voice mail but the mailbox is full. I will try another time another day.  Eulah Pont. Myrtie Neither, MSN, [facility] Gerontological Nurse Practitioner [facility] [phone]

## 2017-03-29 ENCOUNTER — Other Ambulatory Visit: Payer: Self-pay | Admitting: *Deleted

## 2017-03-29 ENCOUNTER — Encounter: Payer: Self-pay | Admitting: *Deleted

## 2017-03-29 NOTE — Patient Outreach (Signed)
Third attempt to complete a [employer] HRA. Pt's voicemail box is full and I was unable to leave a message. I will send him our information and hope he will reach out to me.  Eulah Pont. Myrtie Neither, MSN, [facility] Gerontological Nurse Practitioner [facility] Care Management [phone]
